# Patient Record
Sex: Male | Born: 1995 | Race: Black or African American | Hispanic: No | Marital: Single | Smoking: Never smoker
Health system: Southern US, Community
[De-identification: ages and names within clinical notes are randomized; demographics above are authoritative.]

## PROBLEM LIST (undated history)

## (undated) DIAGNOSIS — J45909 Unspecified asthma, uncomplicated: Secondary | ICD-10-CM

## (undated) DIAGNOSIS — T7840XA Allergy, unspecified, initial encounter: Secondary | ICD-10-CM

## (undated) HISTORY — DX: Allergy, unspecified, initial encounter: T78.40XA

## (undated) HISTORY — DX: Unspecified asthma, uncomplicated: J45.909

## (undated) HISTORY — PX: EYE SURGERY: SHX253

---
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
## 2019-01-07 ENCOUNTER — Other Ambulatory Visit (HOSPITAL_COMMUNITY)
Admission: RE | Admit: 2019-01-07 | Discharge: 2019-01-07 | Disposition: A | Discharge status: Home / Self Care | Payer: BLUE CROSS/BLUE SHIELD | Source: Ambulatory Visit | Attending: Family Medicine | Admitting: Family Medicine

## 2019-01-07 ENCOUNTER — Ambulatory Visit (INDEPENDENT_AMBULATORY_CARE_PROVIDER_SITE_OTHER): Payer: BLUE CROSS/BLUE SHIELD | Admitting: Family Medicine

## 2019-01-07 ENCOUNTER — Encounter: Payer: Self-pay | Admitting: Family Medicine

## 2019-01-07 ENCOUNTER — Other Ambulatory Visit: Payer: Self-pay

## 2019-01-07 VITALS — BP 110/70 | HR 81 | Temp 97.7°F | Resp 18 | Ht 71.0 in | Wt 254.0 lb

## 2019-01-07 DIAGNOSIS — Z113 Encounter for screening for infections with a predominantly sexual mode of transmission: Secondary | ICD-10-CM | POA: Diagnosis present

## 2019-01-07 DIAGNOSIS — Z0000 Encounter for general adult medical examination without abnormal findings: Secondary | ICD-10-CM | POA: Diagnosis not present

## 2019-01-07 DIAGNOSIS — Z1159 Encounter for screening for other viral diseases: Secondary | ICD-10-CM

## 2019-01-07 DIAGNOSIS — Z23 Encounter for immunization: Secondary | ICD-10-CM

## 2019-01-07 DIAGNOSIS — E6609 Other obesity due to excess calories: Secondary | ICD-10-CM | POA: Diagnosis not present

## 2019-01-07 DIAGNOSIS — Z6835 Body mass index (BMI) 35.0-35.9, adult: Secondary | ICD-10-CM

## 2019-01-07 NOTE — Progress Notes (Signed)
Name: Jonathon Draftsric R Armwood Jr.   MRN: [id number]    DOB: [birth year]   Date:01/07/2019       Progress Note  Subjective  Chief Complaint  Chief Complaint  Patient presents with  . Annual Exam    HPI  Patient presents for annual CPE.  USPSTF grade A and B recommendations:  School: He was attending [employer] for Physical Science Major; now attending [employer] to obtain his associates for [employer].   Work: Working at [employer] in [employer] and doing okay - not overly stressed at this time.   Diet: No eating healthy at this time.  Does not eat a lot of fresh fruits and vegetables.  Exercise: Was exercising, but he started going to the chiropractor recently for some low back pain and stopped exercising for the time being.   Depression: phq 9 is negative [facility] 01/07/2019 01/07/2019 09/02/2015 07/22/2015  Decreased Interest 0 0 0 0  Down, Depressed, Hopeless 0 0 0 0  PHQ - 2 Score 0 0 0 0  Altered sleeping 0 0 - -  Tired, decreased energy 0 0 - -  Change in appetite 0 0 - -  Feeling bad or failure about yourself  0 0 - -  Trouble concentrating 0 0 - -  Moving slowly or fidgety/restless 0 0 - -  Suicidal thoughts 0 0 - -  PHQ-9 Score 0 0 - -  Difficult doing work/chores Not difficult at all Not difficult at all - -    Hypertension:  BP Readings from Last 3 Encounters:  01/07/19 110/70  09/02/15 (!) 106/58 (11 %, Z = -1.23 /  13 %, Z = -1.15)*  07/22/15 112/80 (27 %, Z = -0.61 /  85 %, Z = 1.03)*   *BP percentiles are based on the 2017 AAP Clinical Practice Guideline for boys   Obesity: Wt Readings from Last 3 Encounters:  01/07/19 254 lb (115.2 kg)  09/02/15 226 lb (102.5 kg) (98 %, Z= 2.17)*  07/22/15 223 lb 12.8 oz (101.5 kg) (98 %, Z= 2.15)*   * Growth percentiles are based on CDC (Boys, 2-20 Years) data.   BMI Readings from Last 3 Encounters:  01/07/19 35.43 kg/m  09/02/15 31.52 kg/m (98 %, Z= 2.04)*  07/22/15 31.21 kg/m (98 %, Z= 2.01)*    * Growth percentiles are based on CDC (Boys, 2-20 Years) data.    Lipids: We will check today No results found for: CHOL No results found for: HDL No results found for: LDLCALC No results found for: TRIG No results found for: CHOLHDL No results found for: LDLDIRECT Glucose: We will check today No results found for: GLUCOSE, GLUCAP    [facility]  AUDIT-C Score  1    Consumes about once every 2 weeks. Has maybe 1-2 drinks in a sitting.  Occasional marijuana use.   Single STD testing and prevention (HIV/chl/gon/syphilis): 2 partners in the last year; we will check today Hep C: We will check today  Skin cancer: No concerning lesions Colorectal cancer: Denies family or personal history of colorectal cancer, no changes in BM's - no blood in stool, dark and tarry stool, mucus in stool, or constipation/diarrhea. Prostate/Testicular cancer: No concerning symptoms, no family history.   Lung cancer:  N/a Low Dose CT Chest recommended if Age 23-80 years, 30 pack-year currently smoking OR have quit w/in 15years. Patient does not qualify.   AAA: N/A The USPSTF recommends  one-time screening with ultrasonography in men ages 85 to 37 years who have ever smoked ECG:  No chest pain, shortness of breath, palpitations.   Advanced Care Planning: A voluntary discussion about advance care planning including the explanation and discussion of advance directives.  Discussed health care proxy and Living will, and the patient was able to identify a health care proxy as Raelyn Number.  Patient does not have a living will at present time. If patient does have living will, I have requested they bring this to the clinic to be scanned in to their chart.  Patient Active Problem List   Diagnosis Date Noted  . History of pneumonia 09/02/2015  . Asthma, mild intermittent 09/02/2015  . 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 Perennial allergic rhinitis 09/02/2015     Past Surgical History:  Procedure Laterality Date  . EYE SURGERY      Family History  Problem Relation Age of Onset  . Hypertension Mother     Social History   Socioeconomic History  . Marital status: Single    Spouse name: Not on file  . Number of children: Not on file  . Years of education: 62  . Highest education level: Not on file  Occupational History  . Occupation: fulltime Artist  . Occupation: work    Comment: Environmental health practitioner  . Financial resource strain: Not hard at all  . Food insecurity    Worry: Never true    Inability: Never true  . Transportation needs    Medical: No    Non-medical: No  Tobacco Use  . Smoking status: Never Smoker  . Smokeless tobacco: Never Used  Substance and Sexual Activity  . Alcohol use: Yes    Alcohol/week: 0.0 standard drinks    Comment: occasional  . Drug use: Yes    Types: Marijuana  . Sexual activity: Never    Partners: Female  Lifestyle  . Physical activity    Days per week: 0 days    Minutes per session: 0 min  . Stress: Not at all  Relationships  . Social connections    Talks on phone: More than three times a week    Gets together: More than three times a week    Attends religious service: More than 4 times per year    Active member of club or organization: Yes    Attends meetings of clubs or organizations: More than 4 times per year    Relationship status: Never married  . Intimate partner violence    Fear of current or ex partner: No    Emotionally abused: No    Physically abused: No    Forced sexual activity: No  Other Topics Concern  . Not on file  Social History Narrative   Back pain has been seeing Dr. Rock Nephew at [place]     Current Outpatient Medications:  .  albuterol (PROVENTIL HFA;VENTOLIN HFA) 108 (90 Base) MCG/ACT inhaler, Inhale 2 puffs into the lungs every 6 (six) hours as needed for wheezing or shortness of breath., Disp: 1 Inhaler, Rfl: 0 .   beclomethasone (QVAR) 40 MCG/ACT inhaler, Inhale 2 puffs into the lungs 2 (two) times daily., Disp: 1 Inhaler, Rfl: 0 .  cetirizine (ZYRTEC) 10 MG tablet, Take 1 tablet (10 mg total) by mouth daily., Disp: 30 tablet, Rfl: 11 .  azithromycin (ZITHROMAX) 250 MG tablet, Take 2 first day and one daily after that (Patient not taking: Reported on 01/07/2019), Disp: 6  tablet, Rfl: 0  No Known Allergies   ROS  Constitutional: Negative for fever or weight change.  Respiratory: Negative for cough and shortness of breath.   Cardiovascular: Negative for chest pain or palpitations.  Gastrointestinal: Negative for abdominal pain, no bowel changes.  Musculoskeletal: Negative for gait problem or joint swelling.  Skin: Negative for rash.  Neurological: Negative for dizziness or headache.  No other specific complaints in a complete review of systems (except as listed in HPI above).   Objective  Vitals:   01/07/19 1103  BP: 110/70  Pulse: 81  Resp: 18  Temp: 97.7 F (36.5 C)  TempSrc: Oral  SpO2: 99%  Weight: 254 lb (115.2 kg)  Height: 5\' 11"  (1.803 m)    Body mass index is 35.43 kg/m.  Physical Exam  Constitutional: Patient appears well-developed and well-nourished. No distress.  HENT: Head: Normocephalic and atraumatic. Ears: B TMs ok, no erythema or effusion; Nose: Nose normal. Mouth/Throat: Oropharynx is clear and moist. No oropharyngeal exudate.  Eyes: Conjunctivae and EOM are normal. Pupils are equal, round, and reactive to light. No scleral icterus.  Neck: Normal range of motion. Neck supple. No JVD present. No thyromegaly present.  Cardiovascular: Normal rate, regular rhythm and normal heart sounds.  No murmur heard. No BLE edema. Pulmonary/Chest: Effort normal and breath sounds normal. No respiratory distress. Abdominal: Soft. Bowel sounds are normal, no distension. There is no tenderness. no masses MALE GENITALIA: Deferred RECTAL: Deferred Musculoskeletal: Normal range of  motion, no joint effusions. No gross deformities Neurological: he is alert and oriented to person, place, and time. No cranial nerve deficit. Coordination, balance, strength, speech and gait are normal.  Skin: Skin is warm and dry. No rash noted. No erythema.  Psychiatric: Patient has a normal mood and affect. behavior is normal. Judgment and thought content normal.  No results found for this or any previous visit (from the past 2160 hour(s)).   PHQ2/9: Depression screen [facility] 2/9 01/07/2019 01/07/2019 09/02/2015 07/22/2015  Decreased Interest 0 0 0 0  Down, Depressed, Hopeless 0 0 0 0  PHQ - 2 Score 0 0 0 0  Altered sleeping 0 0 - -  Tired, decreased energy 0 0 - -  Change in appetite 0 0 - -  Feeling bad or failure about yourself  0 0 - -  Trouble concentrating 0 0 - -  Moving slowly or fidgety/restless 0 0 - -  Suicidal thoughts 0 0 - -  PHQ-9 Score 0 0 - -  Difficult doing work/chores Not difficult at all Not difficult at all - -   Fall Risk: Fall Risk  01/07/2019 01/07/2019 09/02/2015 07/22/2015  Falls in the past year? 0 0 No No  Number falls in past yr: 0 0 - -  Injury with Fall? 0 0 - -  Follow up Falls evaluation completed Falls evaluation completed - -    Assessment & Plan  1. Annual physical exam -Prostate cancer screening and PSA options (with potential risks and benefits of testing vs not testing) were discussed along with recent recs/guidelines. -USPSTF grade A and B recommendations reviewed with patient; age-appropriate recommendations, preventive care, screening tests, etc discussed and encouraged; healthy living encouraged; see AVS for patient education given to patient -Discussed importance of 150 minutes of physical activity weekly, eat two servings of fish weekly, eat one serving of tree nuts ( cashews, pistachios, pecans, almonds.Marland Kitchen.) every other day, eat 6 servings of fruit/vegetables daily and drink plenty of water and avoid sweet beverages.  -  Amb ref to Medical  Nutrition Therapy-MNT - COMPLETE METABOLIC PANEL WITH GFR - Lipid panel - TSH - HIV Antibody (routine testing w rflx) - RPR - Urine cytology ancillary only - Hepatitis C antibody  2. Class 2 obesity due to excess calories without serious comorbidity with body mass index (BMI) of 35.0 to 35.9 in adult - See above regarding teaching. - Amb ref to Medical Nutrition Therapy-MNT - COMPLETE METABOLIC PANEL WITH GFR - Lipid panel - TSH  3. Routine screening for STI (sexually transmitted infection) - HIV Antibody (routine testing w rflx) - RPR - Urine cytology ancillary only - Hepatitis C antibody  4. Need for hepatitis C screening test - Hepatitis C antibody  5. Need for Tdap vaccination - Tdap vaccine greater than or equal to 7yo IM

## 2019-01-08 LAB — TSH: TSH: 0.61 mIU/L (ref 0.40–4.50)

## 2019-01-08 LAB — COMPLETE METABOLIC PANEL WITH GFR
AG Ratio: 1.4 (calc) (ref 1.0–2.5)
ALT: 21 U/L (ref 9–46)
AST: 17 U/L (ref 10–40)
Albumin: 4.3 g/dL (ref 3.6–5.1)
Alkaline phosphatase (APISO): 91 U/L (ref 36–130)
BUN: 14 mg/dL (ref 7–25)
CO2: 27 mmol/L (ref 20–32)
Calcium: 9.7 mg/dL (ref 8.6–10.3)
Chloride: 103 mmol/L (ref 98–110)
Creat: 1.13 mg/dL (ref 0.60–1.35)
GFR, Est African American: 108 mL/min/{1.73_m2} (ref >=60)
GFR, Est Non African American: 93 mL/min/{1.73_m2} (ref >=60)
Globulin: 3 g/dL (calc) (ref 1.9–3.7)
Glucose, Bld: 59 mg/dL — ABNORMAL LOW (ref 65–99)
Potassium: 4.2 mmol/L (ref 3.5–5.3)
Sodium: 139 mmol/L (ref 135–146)
Total Bilirubin: 0.3 mg/dL (ref 0.2–1.2)
Total Protein: 7.3 g/dL (ref 6.1–8.1)

## 2019-01-08 LAB — HEPATITIS C ANTIBODY
Hepatitis C Ab: NONREACTIVE
SIGNAL TO CUT-OFF: 0.16 (ref <1.00)

## 2019-01-08 LAB — RPR: RPR Ser Ql: NONREACTIVE

## 2019-01-08 LAB — LIPID PANEL
Cholesterol: 111 mg/dL (ref <200)
HDL: 41 mg/dL (ref >=40)
LDL Cholesterol (Calc): 55 mg/dL (calc)
Non-HDL Cholesterol (Calc): 70 mg/dL (calc) (ref <130)
Total CHOL/HDL Ratio: 2.7 (calc) (ref <5.0)
Triglycerides: 71 mg/dL (ref <150)

## 2019-01-08 LAB — HIV ANTIBODY (ROUTINE TESTING W REFLEX): HIV 1&2 Ab, 4th Generation: NONREACTIVE

## 2019-01-09 LAB — URINE CYTOLOGY ANCILLARY ONLY
Chlamydia: NEGATIVE
Neisseria Gonorrhea: NEGATIVE

## 2019-03-28 ENCOUNTER — Ambulatory Visit (INDEPENDENT_AMBULATORY_CARE_PROVIDER_SITE_OTHER): Payer: Self-pay | Admitting: Emergency Medicine

## 2019-03-28 ENCOUNTER — Other Ambulatory Visit: Payer: Self-pay

## 2019-03-28 DIAGNOSIS — Z23 Encounter for immunization: Secondary | ICD-10-CM

## 2019-08-01 ENCOUNTER — Ambulatory Visit: Payer: BLUE CROSS/BLUE SHIELD

## 2019-08-01 ENCOUNTER — Other Ambulatory Visit: Payer: Self-pay

## 2019-08-01 ENCOUNTER — Other Ambulatory Visit: Payer: Self-pay | Admitting: Emergency Medicine

## 2019-08-01 DIAGNOSIS — Z020 Encounter for examination for admission to educational institution: Secondary | ICD-10-CM

## 2019-08-03 LAB — QUANTIFERON-TB GOLD PLUS
Mitogen-NIL: >10 IU/mL
NIL: 0.03 IU/mL
QuantiFERON-TB Gold Plus: NEGATIVE
TB1-NIL: 0 IU/mL
TB2-NIL: 0 IU/mL

## 2019-10-16 DIAGNOSIS — S43401A Unspecified sprain of right shoulder joint, initial encounter: Secondary | ICD-10-CM | POA: Diagnosis not present

## 2019-12-11 DIAGNOSIS — Z20822 Contact with and (suspected) exposure to covid-19: Secondary | ICD-10-CM | POA: Diagnosis not present

## 2019-12-11 DIAGNOSIS — J069 Acute upper respiratory infection, unspecified: Secondary | ICD-10-CM | POA: Diagnosis not present

## 2020-03-09 DIAGNOSIS — Z8616 Personal history of COVID-19: Secondary | ICD-10-CM | POA: Diagnosis not present

## 2020-03-09 DIAGNOSIS — R0982 Postnasal drip: Secondary | ICD-10-CM | POA: Diagnosis not present

## 2020-03-09 DIAGNOSIS — J069 Acute upper respiratory infection, unspecified: Secondary | ICD-10-CM | POA: Diagnosis not present

## 2021-05-27 ENCOUNTER — Ambulatory Visit (INDEPENDENT_AMBULATORY_CARE_PROVIDER_SITE_OTHER): Payer: BC Managed Care – PPO

## 2021-05-27 ENCOUNTER — Other Ambulatory Visit: Payer: Self-pay

## 2021-05-27 DIAGNOSIS — Z23 Encounter for immunization: Secondary | ICD-10-CM | POA: Diagnosis not present

## 2021-12-27 ENCOUNTER — Other Ambulatory Visit (HOSPITAL_COMMUNITY)
Admission: RE | Admit: 2021-12-27 | Discharge: 2021-12-27 | Disposition: A | Discharge status: Home / Self Care | Payer: BC Managed Care – PPO | Source: Ambulatory Visit | Attending: Family Medicine | Admitting: Family Medicine

## 2021-12-27 ENCOUNTER — Encounter: Payer: Self-pay | Admitting: Family Medicine

## 2021-12-27 ENCOUNTER — Ambulatory Visit (INDEPENDENT_AMBULATORY_CARE_PROVIDER_SITE_OTHER): Payer: BC Managed Care – PPO | Admitting: Family Medicine

## 2021-12-27 VITALS — BP 132/84 | HR 91 | Resp 16 | Ht 70.0 in | Wt 235.0 lb

## 2021-12-27 DIAGNOSIS — J4521 Mild intermittent asthma with (acute) exacerbation: Secondary | ICD-10-CM | POA: Diagnosis not present

## 2021-12-27 DIAGNOSIS — N529 Male erectile dysfunction, unspecified: Secondary | ICD-10-CM | POA: Diagnosis not present

## 2021-12-27 DIAGNOSIS — Z113 Encounter for screening for infections with a predominantly sexual mode of transmission: Secondary | ICD-10-CM | POA: Insufficient documentation

## 2021-12-27 DIAGNOSIS — Z0000 Encounter for general adult medical examination without abnormal findings: Secondary | ICD-10-CM

## 2021-12-27 DIAGNOSIS — J3089 Other allergic rhinitis: Secondary | ICD-10-CM | POA: Diagnosis not present

## 2021-12-27 MED ORDER — CETIRIZINE HCL 10 MG PO TABS: 10.0000 mg | ORAL_TABLET | Freq: Every day | ORAL | 11 refills | Status: AC

## 2021-12-27 MED ORDER — ALBUTEROL SULFATE HFA 108 (90 BASE) MCG/ACT IN AERS: 2.0000 | INHALATION_SPRAY | Freq: Four times a day (QID) | RESPIRATORY_TRACT | 0 refills | Status: DC | PRN

## 2021-12-27 NOTE — Progress Notes (Signed)
BP 132/84   Pulse 91   Resp 16   Ht 5\' 10"  (1.778 m)   Wt 235 lb (106.6 kg)   SpO2 98%   BMI 33.72 kg/m    Subjective:    Patient ID: ., male    DOB: 03-14-1996, 26 y.o.   MRN: [id number]  HPI: Jonathon Diaz. is a 26 y.o. male presenting on 12/27/2021 for comprehensive medical examination. Current medical complaints include: erectile dysfunction  Erectile Dysfunction Has been having difficulty maintaining erection since about 26yo when his partner at that time informed him she had gotten an STI. Since then, about half the time he has had difficulties maintaining erections. He has no issues with masturbation and is able to orgasm. Denies pain with orgasm. Reports he does get anxious/nervous prior to sex because of this. States current relationship (~6 months) is going well and partner is understanding. 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 Denies trouble sleeping, general anxiousness. Work is going well. He drinks about once per month, usually 4-5 shots of tequila or vodka and 1-2 bottles of beer. Does not smoke. No FH of diabetes. Just recently started exercising, jump rope and resistance bands. No pain with orgasm.  No trouble with urination. No rashes, ulcers. Seeing therapist regularly for the past 4 months. Does not wear condoms. Taking Royal Honey with some benefit.  Mild Intermittent Asthma, Seasonal allergies - Medications: albuterol PRN, zyrtec - Taking: not used albuterol  - Common triggers: respiratory illness - ED visits/hospitalization in the last 6 months: 0 - Current symptoms: none  He currently lives with: mom  Depression Screen done today and results listed below:     12/27/2021   11:14 AM 01/07/2019   11:08 AM 01/07/2019    9:12 AM 09/02/2015   11:45 AM 07/22/2015    8:25 AM  Depression screen PHQ 2/9  Decreased Interest 0 0 0 0 0  Down, Depressed, Hopeless 0 0 0 0 0  PHQ - 2  Score 0 0 0 0 0  Altered sleeping  0 0    Tired, decreased energy  0 0    Change in appetite  0 0    Feeling bad or failure about yourself   0 0    Trouble concentrating  0 0    Moving slowly or fidgety/restless  0 0    Suicidal thoughts  0 0    PHQ-9 Score  0 0    Difficult doing work/chores  Not difficult at all Not difficult at all        12/27/2021   11:31 AM 01/07/2019   11:08 AM  GAD 7 : Generalized Anxiety Score  Nervous, Anxious, on Edge 0 0  Control/stop worrying 1 0  Worry too much - different things 1 0  Trouble relaxing 0 0  Restless 0 0  Easily annoyed or irritable 1 0  Afraid - awful might happen 0 0  Total GAD 7 Score 3 0  Anxiety Difficulty  Not difficult at all       Past Medical History:  Past Medical History:  Diagnosis Date   Allergy    Asthma    History of pneumonia 09/02/2015    Surgical History:  Past Surgical History:  Procedure Laterality Date   EYE SURGERY      Medications:  Current Outpatient Medications on File Prior to Visit  Medication Sig  cetirizine (ZYRTEC) 10 MG tablet Take 1 tablet (10 mg total) by mouth daily.   No current facility-administered medications on file prior to visit.    Allergies:  No Known Allergies  Social History:  Social History   Socioeconomic History   Marital status: Single    Spouse name: Not on file   Number of children: Not on file   Years of education: 15   Highest education level: Not on file  Occupational History   Occupation: fulltime student    Comment: [employer]   Occupation: work    Comment: [employer]  Tobacco Use   Smoking status: Never   Smokeless tobacco: Never  Vaping Use   Vaping Use: Never used  Substance and Sexual Activity   Alcohol use: Yes    Alcohol/week: 0.0 standard drinks    Comment: occasional   Drug use: Yes    Types: Marijuana   Sexual activity: Never    Partners: Female    Birth control/protection: Condom  Other Topics Concern   Not on file  Social History  Narrative   Back pain has been seeing Dr. Anner Crete at [facility]   Social Determinants of Health   Financial Resource Strain: Low Risk    Difficulty of Paying Living Expenses: Not hard at all  Food Insecurity: No Food Insecurity   Worried About Programme researcher, broadcasting/film/video in the Last Year: Never true   Barista in the Last Year: Never true  Transportation Needs: No Transportation Needs   Lack of Transportation (Medical): No   Lack of Transportation (Non-Medical): No  Physical Activity: Insufficiently Active   Days of Exercise per Week: 2 days   Minutes of Exercise per Session: 20 min  Stress: No Stress Concern Present   Feeling of Stress : Only a little  Social Connections: Moderately Isolated   Frequency of Communication with Friends and Family: More than three times a week   Frequency of Social Gatherings with Friends and Family: Once a week   Attends Religious Services: More than 4 times per year   Active Member of [employer] or Organizations: No   Attends Engineer, structural: Never   Marital Status: Never married  Catering manager Violence: Not At Risk   Fear of Current or Ex-Partner: No   Emotionally Abused: No   Physically Abused: No   Sexually Abused: No   Social History   Tobacco Use  Smoking Status Never  Smokeless Tobacco Never   Social History   Substance and Sexual Activity  Alcohol Use Yes   Alcohol/week: 0.0 standard drinks   Comment: occasional    Family History:  Family History  Problem Relation Age of Onset   Hypertension Mother     Past medical history, surgical history, medications, allergies, family history and social history reviewed with patient today and changes made to appropriate areas of the chart.      Objective:    BP 132/84   Pulse 91   Resp 16   Ht 5\' 10"  (1.778 m)   Wt 235 lb (106.6 kg)   SpO2 98%   BMI 33.72 kg/m   Wt Readings from Last 3 Encounters:  12/27/21 235 lb (106.6 kg)  01/07/19 254 lb (115.2 kg)   09/02/15 226 lb (102.5 kg) (98 %, Z= 2.17)*   * Growth percentiles are based on CDC (Boys, 2-20 Years) data.    Physical Exam Constitutional:      Appearance: Normal appearance.  HENT:  Head: Normocephalic.     Right Ear: Tympanic membrane, ear canal and external ear normal.     Left Ear: Tympanic membrane, ear canal and external ear normal.     Nose: Nose normal.     Mouth/Throat:     Mouth: Mucous membranes are moist.     Pharynx: Oropharynx is clear.  Eyes:     Extraocular Movements: Extraocular movements intact.     Pupils: Pupils are equal, round, and reactive to light.  Cardiovascular:     Rate and Rhythm: Normal rate and regular rhythm.     Heart sounds: Normal heart sounds. No murmur heard. Pulmonary:     Effort: Pulmonary effort is normal. No respiratory distress.     Breath sounds: Normal breath sounds.  Abdominal:     General: Bowel sounds are normal.     Palpations: Abdomen is soft.     Tenderness: There is no abdominal tenderness. There is no guarding.  Musculoskeletal:        General: Normal range of motion.     Right lower leg: No edema.     Left lower leg: No edema.  Lymphadenopathy:     Cervical: No cervical adenopathy.  Skin:    General: Skin is warm and dry.  Neurological:     Mental Status: He is alert and oriented to person, place, and time. Mental status is at baseline.     Gait: Gait normal.  Psychiatric:        Mood and Affect: Mood normal.        Behavior: Behavior normal.    Results for orders placed or performed in visit on 08/01/19  QuantiFERON-TB Gold Plus  Result Value Ref Range   QuantiFERON-TB Gold Plus NEGATIVE NEGATIVE   NIL 0.03 IU/mL   Mitogen-NIL >10.00 IU/mL   TB1-NIL 0.00 IU/mL   TB2-NIL 0.00 IU/mL      Assessment & Plan:   Problem List Items Addressed This Visit       Respiratory   Asthma, mild intermittent    Doing well on current regimen, no changes made today.       Relevant Medications   albuterol  (VENTOLIN HFA) 108 (90 Base) MCG/ACT inhaler   Perennial allergic rhinitis    Doing well on current regimen, no changes made today.         Other   Erectile dysfunction    Likely psychological cause given started after partner informed him of STI. GAD/PHQ unremarkable today and otherwise without anxious symptoms, will defer SSRI for now, recommend continuing to work with therapist and monitor closely. Will obtain labs to rule out other possible contributors. Counseled on condom use.        Other Visit Diagnoses     Well adult exam    -  Primary   Relevant Orders   Hepatitis C antibody   HIV antibody (with reflex)   RPR   Urine cytology ancillary only   Comprehensive metabolic panel   Lipid panel   Hemoglobin A1c   CBC        LABORATORY TESTING:  Health maintenance labs ordered today as discussed above.   IMMUNIZATIONS:   - Tdap: Tetanus vaccination status reviewed: last tetanus booster within 10 years. - Influenza: Postponed to flu season - Pneumococcal: Refused - HPV: Up to date - Shingrix vaccine: Not applicable - COVID vaccine: 2 doses of mRNA vaccine  SCREENING: - Colonoscopy: Not applicable  Discussed with patient purpose of the colonoscopy is to detect  colon cancer at curable precancerous or early stages   - AAA Screening: Not applicable  - Lung cancer screening: n/a  Hep C Screening: done today STD testing and prevention (HIV/chl/gon/syphilis): done today Sexual History: monogamous with current partner, ~6 months Incontinence Symptoms: none  PATIENT COUNSELING:    Advanced Care Planning: A voluntary discussion about advance care planning including the explanation and discussion of advance directives.  Discussed health care proxy and Living will, and the patient was able to identify a health care proxy as mom, Markas Aldredge.  Patient does not have a living will at present time. If patient does have living will, I have requested they bring this to the  clinic to be scanned in to their chart.  Sexuality: Discussed sexually transmitted diseases, partner selection, use of condoms, avoidance of unintended pregnancy  and contraceptive alternatives.   Advised to avoid cigarette smoking.  I discussed with the patient that most people either abstain from alcohol or drink within safe limits (<=14/week and <=4 drinks/occasion for males, <=7/weeks and <= 3 drinks/occasion for females) and that the risk for alcohol disorders and other health effects rises proportionally with the number of drinks per week and how often a drinker exceeds daily limits.  Discussed cessation/primary prevention of drug use and availability of treatment for abuse.   Diet: Encouraged to adjust caloric intake to maintain  or achieve ideal body weight, to reduce intake of dietary saturated fat and total fat, to limit sodium intake by avoiding high sodium foods and not adding table salt, and to maintain adequate dietary potassium and calcium preferably from fresh fruits, vegetables, and low-fat dairy products.    Stressed the importance of regular exercise  Injury prevention: Discussed safety belts, safety helmets, smoke detector, smoking near bedding or upholstery.   Dental health: Discussed importance of regular tooth brushing, flossing, and dental visits.   Follow up plan: NEXT PREVENTATIVE PHYSICAL DUE IN 1 YEAR. No follow-ups on file.

## 2021-12-27 NOTE — Assessment & Plan Note (Signed)
Likely psychological cause given started after partner informed him of STI. GAD/PHQ unremarkable today and otherwise without anxious symptoms, will defer SSRI for now, recommend continuing to work with therapist and monitor closely. Will obtain labs to rule out other possible contributors. Counseled on condom use.

## 2021-12-27 NOTE — Assessment & Plan Note (Signed)
Doing well on current regimen, no changes made today. 

## 2021-12-27 NOTE — Addendum Note (Signed)
Addended by: Caro Laroche on: 12/27/2021 02:43 PM   Modules accepted: Orders

## 2021-12-28 LAB — CBC
HCT: 46.1 % (ref 38.5–50.0)
Hemoglobin: 15.2 g/dL (ref 13.2–17.1)
MCH: 27.2 pg (ref 27.0–33.0)
MCHC: 33 g/dL (ref 32.0–36.0)
MCV: 82.6 fL (ref 80.0–100.0)
MPV: 13 fL — ABNORMAL HIGH (ref 7.5–12.5)
Platelets: 188 10*3/uL (ref 140–400)
RBC: 5.58 10*6/uL (ref 4.20–5.80)
RDW: 13 % (ref 11.0–15.0)
WBC: 4.7 10*3/uL (ref 3.8–10.8)

## 2021-12-28 LAB — LIPID PANEL
Cholesterol: 131 mg/dL (ref <200)
HDL: 51 mg/dL (ref >=40)
LDL Cholesterol (Calc): 69 mg/dL (calc)
Non-HDL Cholesterol (Calc): 80 mg/dL (calc) (ref <130)
Total CHOL/HDL Ratio: 2.6 (calc) (ref <5.0)
Triglycerides: 38 mg/dL (ref <150)

## 2021-12-28 LAB — COMPREHENSIVE METABOLIC PANEL
AG Ratio: 1.4 (calc) (ref 1.0–2.5)
ALT: 18 U/L (ref 9–46)
AST: 16 U/L (ref 10–40)
Albumin: 4.4 g/dL (ref 3.6–5.1)
Alkaline phosphatase (APISO): 83 U/L (ref 36–130)
BUN/Creatinine Ratio: 15 (calc) (ref 6–22)
BUN: 19 mg/dL (ref 7–25)
CO2: 27 mmol/L (ref 20–32)
Calcium: 9.8 mg/dL (ref 8.6–10.3)
Chloride: 103 mmol/L (ref 98–110)
Creat: 1.27 mg/dL — ABNORMAL HIGH (ref 0.60–1.24)
Globulin: 3.2 g/dL (calc) (ref 1.9–3.7)
Glucose, Bld: 85 mg/dL (ref 65–99)
Potassium: 5.3 mmol/L (ref 3.5–5.3)
Sodium: 137 mmol/L (ref 135–146)
Total Bilirubin: 0.4 mg/dL (ref 0.2–1.2)
Total Protein: 7.6 g/dL (ref 6.1–8.1)

## 2021-12-28 LAB — HEMOGLOBIN A1C
Hgb A1c MFr Bld: 5.1 % of total Hgb (ref <5.7)
Mean Plasma Glucose: 100 mg/dL
eAG (mmol/L): 5.5 mmol/L

## 2021-12-28 LAB — URINE CYTOLOGY ANCILLARY ONLY
Chlamydia: NEGATIVE
Comment: NEGATIVE
Comment: NEGATIVE
Comment: NORMAL
Neisseria Gonorrhea: NEGATIVE
Trichomonas: NEGATIVE

## 2021-12-28 LAB — HIV ANTIBODY (ROUTINE TESTING W REFLEX): HIV 1&2 Ab, 4th Generation: NONREACTIVE

## 2021-12-28 LAB — RPR: RPR Ser Ql: NONREACTIVE

## 2021-12-28 LAB — HEPATITIS C ANTIBODY
Hepatitis C Ab: NONREACTIVE
SIGNAL TO CUT-OFF: 0.25 (ref <1.00)

## 2022-01-25 ENCOUNTER — Other Ambulatory Visit: Payer: Self-pay | Admitting: Family Medicine

## 2022-01-25 DIAGNOSIS — J4521 Mild intermittent asthma with (acute) exacerbation: Secondary | ICD-10-CM

## 2022-01-26 NOTE — Telephone Encounter (Signed)
Requested Prescriptions  Pending Prescriptions Disp Refills  . albuterol (VENTOLIN HFA) 108 (90 Base) MCG/ACT inhaler [Pharmacy Med Name: ALBUTEROL HFA (PROAIR) INHALER] 8.5 each 3    Sig: TAKE 2 PUFFS BY MOUTH EVERY 6 HOURS AS NEEDED FOR WHEEZE OR SHORTNESS OF BREATH     Pulmonology:  [facility] 2 Passed - 01/25/2022  1:40 AM      Passed - Last BP in normal range    BP Readings from Last 1 Encounters:  12/27/21 132/84         Passed - Last Heart Rate in normal range    Pulse Readings from Last 1 Encounters:  12/27/21 91         Passed - Valid encounter within last 12 months    Recent Outpatient Visits          1 month ago Well adult exam   [facility] Caro Laroche, DO   3 years ago Annual physical exam   [facility] Doren Custard, FNP   6 years ago Asthma, mild intermittent, with acute exacerbation   [facility] Alba Cory, MD   6 years ago Well child check   [facility] Dennison Mascot, MD      Future Appointments            In 11 months Zane Herald, Rudolpho Sevin, FNP [facility]

## 2022-03-24 ENCOUNTER — Telehealth (INDEPENDENT_AMBULATORY_CARE_PROVIDER_SITE_OTHER): Payer: BC Managed Care – PPO | Admitting: Nurse Practitioner

## 2022-03-24 ENCOUNTER — Other Ambulatory Visit: Payer: Self-pay

## 2022-03-24 ENCOUNTER — Encounter: Payer: Self-pay | Admitting: Nurse Practitioner

## 2022-03-24 DIAGNOSIS — J4521 Mild intermittent asthma with (acute) exacerbation: Secondary | ICD-10-CM | POA: Diagnosis not present

## 2022-03-24 DIAGNOSIS — J069 Acute upper respiratory infection, unspecified: Secondary | ICD-10-CM | POA: Diagnosis not present

## 2022-03-24 MED ORDER — AZITHROMYCIN 250 MG PO TABS: ORAL_TABLET | ORAL | 0 refills | Status: AC

## 2022-03-24 MED ORDER — BENZONATATE 100 MG PO CAPS: 200.0000 mg | ORAL_CAPSULE | Freq: Two times a day (BID) | ORAL | 0 refills | Status: AC | PRN

## 2022-03-24 NOTE — Progress Notes (Signed)
Name: Jonathon Diaz.   MRN: [id number]    DOB: Nov 23, 1995   Date:03/24/2022       Progress Note  Subjective  Chief Complaint  Chief Complaint  Patient presents with   URI    Nose stopped up, headache, sinus, drainage took 2 covid test that was negative for 7 days    I connected with  Astrid [employer].  on 03/24/22 at 8:00 am  by a video enabled telemedicine application and verified that I am speaking with the correct person using two identifiers.  I discussed the limitations of evaluation and management by telemedicine and the availability of in person appointments. The patient expressed understanding and agreed to proceed with a virtual visit  Staff also discussed with the patient that there may be a patient responsible charge related to this service. Patient Location: home Provider Location: [facility] Additional Individuals present: alone  HPI  URI: Patient reports that his symptoms started over a week ago.  He reports headache, sinus congestion, cough and drainage, facial pressure.  Patient states he has taken two covid tests which were negative.  Patient denies any shortness of breath or fever.  He has been taking his zyrtec and using his albuterol inhaler.  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  Discussed that this is likely viral.  Recommend continuing zyrtec, may add flonase.  Will send in tessalon perls for cough.  Will send in zpac, start if no improvement in the next few days.    Asthma: Patient reports his asthma has been fine. He has an albuterol inhaler if he needs it.  He denies any fever or shortness of breath. Will continue with current treatment.   Patient Active Problem List   Diagnosis Date Noted   Erectile dysfunction 12/27/2021   Asthma, mild intermittent 09/02/2015   Obesity 09/02/2015   Perennial allergic rhinitis 09/02/2015    Social History   Tobacco Use   Smoking status: Never   Smokeless tobacco: Never  Substance Use Topics   Alcohol use:  Yes    Alcohol/week: 0.0 standard drinks of alcohol    Comment: occasional     Current Outpatient Medications:    albuterol (VENTOLIN HFA) 108 (90 Base) MCG/ACT inhaler, TAKE 2 PUFFS BY MOUTH EVERY 6 HOURS AS NEEDED FOR WHEEZE OR SHORTNESS OF BREATH, Disp: 8.5 each, Rfl: 3   cetirizine (ZYRTEC) 10 MG tablet, Take 1 tablet (10 mg total) by mouth daily., Disp: 30 tablet, Rfl: 11  No Known Allergies  I personally reviewed active problem list, medication list, allergies, notes from last encounter with the patient/caregiver today.  ROS  Constitutional: Negative for fever or weight change.  HEENT: positive for nasal congestion, drainage, facial pressure Respiratory: positive for cough and negative for shortness of breath.   Cardiovascular: Negative for chest pain or palpitations.  Gastrointestinal: Negative for abdominal pain, no bowel changes.  Musculoskeletal: Negative for gait problem or joint swelling.  Skin: Negative for rash.  Neurological: Negative for dizziness, positive for headache.  No other specific complaints in a complete review of systems (except as listed in HPI above).   Objective  Virtual encounter, vitals not obtained.  There is no height or weight on file to calculate BMI.  Nursing Note and Vital Signs reviewed.  Physical Exam  Awake, alert and oriented, speaking in complete sentences  No results found for this or any previous visit (from the past 72 hour(s)).  Assessment & Plan  1. Viral upper  respiratory tract infection -continue taking zyrtec, add flonase - if no improvement in next few days may start zpac - azithromycin (ZITHROMAX) 250 MG tablet; Take 2 tablets on day 1, then 1 tablet daily on days 2 through 5  Dispense: 6 tablet; Refill: 0 - benzonatate (TESSALON) 100 MG capsule; Take 2 capsules (200 mg total) by mouth 2 (two) times daily as needed for cough.  Dispense: 20 capsule; Refill: 0  2. Mild intermittent asthma with acute exacerbation -use  albuterol inhaler as needed.   -Red flags and when to present for emergency care or RTC including fever >101.75F, chest pain, shortness of breath, new/worsening/un-resolving symptoms,  reviewed with patient at time of visit. Follow up and care instructions discussed and provided in AVS. - I discussed the assessment and treatment plan with the patient. The patient was provided an opportunity to ask questions and all were answered. The patient agreed with the plan and demonstrated an understanding of the instructions.  I provided 20 minutes of non-face-to-face time during this encounter.  Berniece Salines, FNP

## 2023-01-02 ENCOUNTER — Encounter: Payer: BC Managed Care – PPO | Admitting: Nurse Practitioner

## 2023-01-09 ENCOUNTER — Encounter: Payer: BC Managed Care – PPO | Admitting: Nurse Practitioner

## 2023-05-26 ENCOUNTER — Ambulatory Visit (INDEPENDENT_AMBULATORY_CARE_PROVIDER_SITE_OTHER): Payer: BC Managed Care – PPO

## 2023-05-26 DIAGNOSIS — Z23 Encounter for immunization: Secondary | ICD-10-CM

## 2023-05-26 NOTE — Progress Notes (Signed)
Patient presented to clinic in expressed good health for the flu vaccine. He tolerated injection well and had no questions or concerns to express at time of clinic departure.
